# Patient Record
Sex: Male | Born: 1975 | Race: Black or African American | Hispanic: No | Marital: Married | Smoking: Former smoker
Health system: Southern US, Community
[De-identification: ages and names within clinical notes are randomized; demographics above are authoritative.]

## PROBLEM LIST (undated history)

## (undated) HISTORY — PX: APPENDECTOMY: SHX54

## (undated) HISTORY — PX: TONSILLECTOMY: SUR1361

---

## 2009-11-03 ENCOUNTER — Ambulatory Visit: Payer: Self-pay | Admitting: Radiology

## 2009-11-03 ENCOUNTER — Emergency Department (HOSPITAL_BASED_OUTPATIENT_CLINIC_OR_DEPARTMENT_OTHER): Admission: EM | Admit: 2009-11-03 | Discharge: 2009-11-03 | Payer: Self-pay | Admitting: Emergency Medicine

## 2009-11-04 ENCOUNTER — Emergency Department (HOSPITAL_BASED_OUTPATIENT_CLINIC_OR_DEPARTMENT_OTHER): Admission: EM | Admit: 2009-11-04 | Discharge: 2009-11-04 | Payer: Self-pay | Admitting: Emergency Medicine

## 2010-05-11 LAB — DIFFERENTIAL
Eosinophils Absolute: 0 10*3/uL (ref 0.0–0.7)
Lymphocytes Relative: 14 % (ref 12–46)
Lymphs Abs: 1.2 10*3/uL (ref 0.7–4.0)
Monocytes Relative: 10 % (ref 3–12)
Neutro Abs: 6.3 10*3/uL (ref 1.7–7.7)
Neutrophils Relative %: 74 % (ref 43–77)

## 2010-05-11 LAB — CBC
Hemoglobin: 16.1 g/dL (ref 13.0–17.0)
Platelets: 153 10*3/uL (ref 150–400)
RBC: 5.44 MIL/uL (ref 4.22–5.81)
WBC: 8.5 10*3/uL (ref 4.0–10.5)

## 2010-05-11 LAB — BASIC METABOLIC PANEL
Calcium: 9.6 mg/dL (ref 8.4–10.5)
Creatinine, Ser: 1.1 mg/dL (ref 0.4–1.5)
GFR calc Af Amer: >60 mL/min (ref >60)
GFR calc non Af Amer: >60 mL/min (ref >60)
Sodium: 139 mEq/L (ref 135–145)

## 2016-05-28 ENCOUNTER — Emergency Department (HOSPITAL_BASED_OUTPATIENT_CLINIC_OR_DEPARTMENT_OTHER): Payer: 59

## 2016-05-28 ENCOUNTER — Emergency Department (HOSPITAL_BASED_OUTPATIENT_CLINIC_OR_DEPARTMENT_OTHER)
Admission: EM | Admit: 2016-05-28 | Discharge: 2016-05-28 | Disposition: A | Discharge status: Home / Self Care | Payer: 59 | Attending: Emergency Medicine | Admitting: Emergency Medicine

## 2016-05-28 ENCOUNTER — Encounter (HOSPITAL_BASED_OUTPATIENT_CLINIC_OR_DEPARTMENT_OTHER): Payer: Self-pay | Admitting: Emergency Medicine

## 2016-05-28 DIAGNOSIS — Z5321 Procedure and treatment not carried out due to patient leaving prior to being seen by health care provider: Secondary | ICD-10-CM | POA: Insufficient documentation

## 2016-05-28 DIAGNOSIS — M25531 Pain in right wrist: Secondary | ICD-10-CM | POA: Diagnosis not present

## 2016-05-28 DIAGNOSIS — Z87891 Personal history of nicotine dependence: Secondary | ICD-10-CM | POA: Insufficient documentation

## 2016-05-28 NOTE — ED Notes (Signed)
Alert, NAD, calm, interactive, resps e/u, speaking in clear complete sentences, no dyspnea noted, skin W&D, VSS, c/o R wrist pain, effecting job of lifting, possible injury doing back springs/flips, rates 6/10 with some swelling, (denies: fever, numbness, tingling, weakness, sob, nausea, dizziness).

## 2016-05-28 NOTE — ED Notes (Addendum)
EDPA in to see, at [facility]. PT not in room. Unable to find. Not in xray opr w/r. LWBS/eloped.

## 2016-05-28 NOTE — ED Notes (Signed)
Pt not found in ED.  PA looked for pt in [facility] but he was not there.  Registration staff sts pt was seen leaving the ED.

## 2016-05-28 NOTE — ED Notes (Signed)
Patient transported to X-ray 

## 2016-05-28 NOTE — ED Triage Notes (Signed)
Pt c/o RT wrist pain x 3 wks; sts was doing flips several weeks ago and jammed hand/wrist when landing.

## 2016-05-29 NOTE — ED Provider Notes (Signed)
Benna Dunks Pt left without being seen.  I did not see or evaluate this patient.    [place] N Russo, PA-C 05/29/16 0140    Lyndal Pulley, MD 05/29/16 [phone]

## 2016-05-30 ENCOUNTER — Emergency Department (HOSPITAL_BASED_OUTPATIENT_CLINIC_OR_DEPARTMENT_OTHER)
Admission: EM | Admit: 2016-05-30 | Discharge: 2016-05-30 | Disposition: A | Discharge status: Home / Self Care | Payer: 59 | Attending: Emergency Medicine | Admitting: Emergency Medicine

## 2016-05-30 ENCOUNTER — Encounter (HOSPITAL_BASED_OUTPATIENT_CLINIC_OR_DEPARTMENT_OTHER): Payer: Self-pay | Admitting: *Deleted

## 2016-05-30 DIAGNOSIS — Y9389 Activity, other specified: Secondary | ICD-10-CM | POA: Diagnosis not present

## 2016-05-30 DIAGNOSIS — Z87891 Personal history of nicotine dependence: Secondary | ICD-10-CM | POA: Insufficient documentation

## 2016-05-30 DIAGNOSIS — S6991XA Unspecified injury of right wrist, hand and finger(s), initial encounter: Secondary | ICD-10-CM | POA: Diagnosis present

## 2016-05-30 DIAGNOSIS — Y929 Unspecified place or not applicable: Secondary | ICD-10-CM | POA: Insufficient documentation

## 2016-05-30 DIAGNOSIS — X500XXA Overexertion from strenuous movement or load, initial encounter: Secondary | ICD-10-CM | POA: Diagnosis not present

## 2016-05-30 DIAGNOSIS — M778 Other enthesopathies, not elsewhere classified: Secondary | ICD-10-CM | POA: Insufficient documentation

## 2016-05-30 DIAGNOSIS — Y990 Civilian activity done for income or pay: Secondary | ICD-10-CM | POA: Diagnosis not present

## 2016-05-30 MED ORDER — IBUPROFEN 600 MG PO TABS: 600.0000 mg | ORAL_TABLET | Freq: Four times a day (QID) | ORAL | 0 refills | Status: AC | PRN

## 2016-05-30 MED FILL — IBUPROFEN 600 MG TABLET: 600 | 8 days supply | Qty: 30 | Fill #0

## 2016-05-30 NOTE — ED Provider Notes (Signed)
[facility] Note   CSN: [id number] Arrival date & time: 05/30/16  1539     History   Chief Complaint Chief Complaint  Patient presents with  . Hand Pain    HPI Gary Warner is a 41 y.o. male.  HPI Patient with 3-4 weeks of right wrist pain. States pain is worse with extension of the wrist. Recently started a new job where he is doing a lot of heavy lifting. No other known injuries. Thinks had some mild swelling in the area. Denies any fever or chills. No redness or warmth. Patient is right-hand dominant. Denies any tingling or numbness sensation to the fingers. No weakness. History reviewed. No pertinent past medical history.  There are no active problems to display for this patient.   Past Surgical History:  Procedure Laterality Date  . APPENDECTOMY    . TONSILLECTOMY         Home Medications    Prior to Admission medications   Medication Sig Start Date End Date Taking? Authorizing Provider  ibuprofen (ADVIL,MOTRIN) 600 MG tablet Take 1 tablet (600 mg total) by mouth every 6 (six) hours as needed. 05/30/16   Loren Racer, MD    Family History History reviewed. No pertinent family history.  Social History Social History  Substance Use Topics  . Smoking status: Former Games developer  . Smokeless tobacco: Never Used  . Alcohol use 1.8 oz/week    3 Cans of beer per week     Comment: daily      Allergies   Patient has no known allergies.   Review of Systems Review of Systems  Constitutional: Negative for chills and fever.  Musculoskeletal: Positive for arthralgias. Negative for back pain and neck pain.  Skin: Negative for rash and wound.  Neurological: Negative for weakness and numbness.  All other systems reviewed and are negative.    Physical Exam Updated Vital Signs BP (!) 139/101 (BP Location: Left Arm)   Pulse 67   Temp 98.3 F (36.8 C) (Oral)   Resp 18   Ht  (1.727 m)   Wt 240 lb (108.9 kg)   SpO2 97%   BMI 36.49  kg/m   Physical Exam  Constitutional: He is oriented to person, place, and time. He appears well-developed and well-nourished.  HENT:  Head: Normocephalic and atraumatic.  Eyes: EOM are normal. Pupils are equal, round, and reactive to light.  Neck: Normal range of motion. Neck supple.  Cardiovascular: Normal rate.   Pulmonary/Chest: Effort normal.  Abdominal: Soft.  Musculoskeletal: Normal range of motion. He exhibits tenderness. He exhibits no edema.  Patient has tenderness to palpation along the extensor tendon of the right wrist.  mostly over the radial side. Pain is also exacerbated with extension of the wrist. No masses appreciated. No snuffbox tenderness. No erythema or warmth or swelling noted. Distal pulses are 2+. Negative Tinel and Phalen sign.  Neurological: He is alert and oriented to person, place, and time.  Sensation fully intact. Equal bilateral grip strength. Normal muscle strength of the intrinsic muscles of the right hand.  Skin: Skin is warm and dry. Capillary refill takes less than 2 seconds. No rash noted. No erythema.  Psychiatric: He has a normal mood and affect. His behavior is normal.  Nursing note and vitals reviewed.    ED Treatments / Results  Labs (all labs ordered are listed, but only abnormal results are displayed) Labs Reviewed - No data to display  EKG  EKG Interpretation None  Radiology Dg Wrist Complete Right  Result Date: 05/28/2016 CLINICAL DATA:  Pain after doing handsprings and back flips. EXAM: RIGHT WRIST - COMPLETE 3+ VIEW COMPARISON:  None. FINDINGS: There is no evidence of fracture or dislocation. Dedicated view of the scaphoid demonstrates an intact scaphoid. There is no evidence of arthropathy or other focal bone abnormality. Soft tissues are unremarkable. IMPRESSION: No acute osseous abnormality. Electronically Signed   By: Tollie Eth M.D.   On: 05/28/2016 18:35   Dg Hand Complete Right  Result Date: 05/28/2016 CLINICAL  DATA:  Radial sided hand and wrist pain after doing hand sprains of back flips. EXAM: RIGHT HAND - COMPLETE 3+ VIEW COMPARISON:  None. FINDINGS: There is no evidence of fracture or dislocation. There is no evidence of arthropathy or other focal bone abnormality. Soft tissues are unremarkable. IMPRESSION: Negative for acute fracture or malalignment. Electronically Signed   By: Tollie Eth M.D.   On: 05/28/2016 18:34    Procedures Procedures (including critical care time)  Medications Ordered in ED Medications - No data to display   Initial Impression / Assessment and Plan / ED Course  I have reviewed the triage vital signs and the nursing notes.  Pertinent labs & imaging results that were available during my care of the patient were reviewed by me and considered in my medical decision making (see chart for details).     Patient with likely overuse tendinitis of the right wrist. X-rays performed earlier demonstrate no fractures or dislocations. We'll place in a wrist splint, advised rest and start on ibuprofen. Patient is to follow-up with sports medicine should his symptoms not improve.  Final Clinical Impressions(s) / ED Diagnoses   Final diagnoses:  Right wrist tendonitis    New Prescriptions New Prescriptions   IBUPROFEN (ADVIL,MOTRIN) 600 MG TABLET    Take 1 tablet (600 mg total) by mouth every 6 (six) hours as needed.     Loren Racer, MD 05/30/16 [phone]

## 2016-05-30 NOTE — ED Triage Notes (Signed)
Pain in right wrist x several weeks.  Left AMA previously this week after having xrays done.  Denies pain unless he is moving his wrist.

## 2018-07-07 IMAGING — CR DG WRIST COMPLETE 3+V*R*
4 series · 4 of 4 positions shown · non-contrast
Comparison: None.

CLINICAL DATA: Pain after doing handsprings and back flips.

EXAM:
RIGHT WRIST - COMPLETE 3+ VIEW

[x wrist lat right]
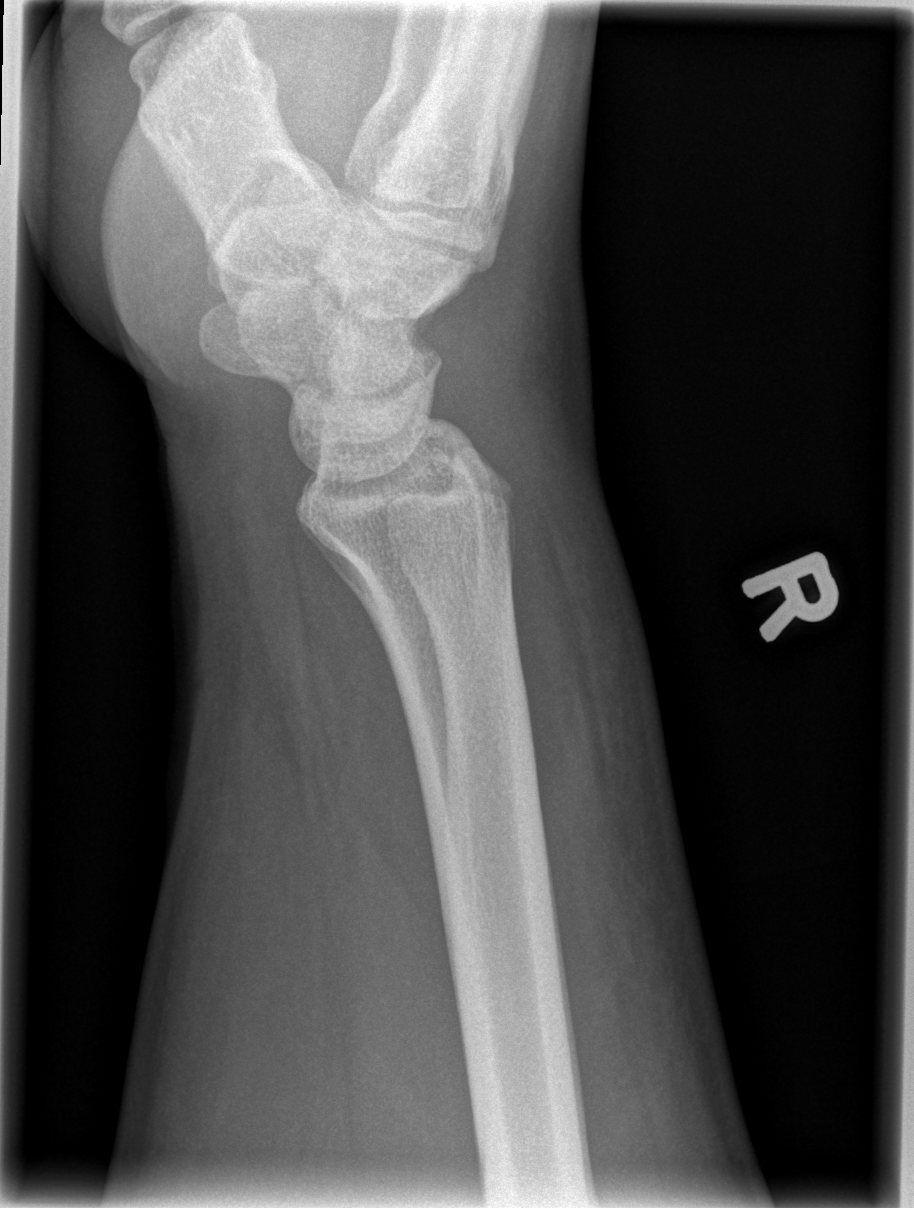

[x wrist obl right]
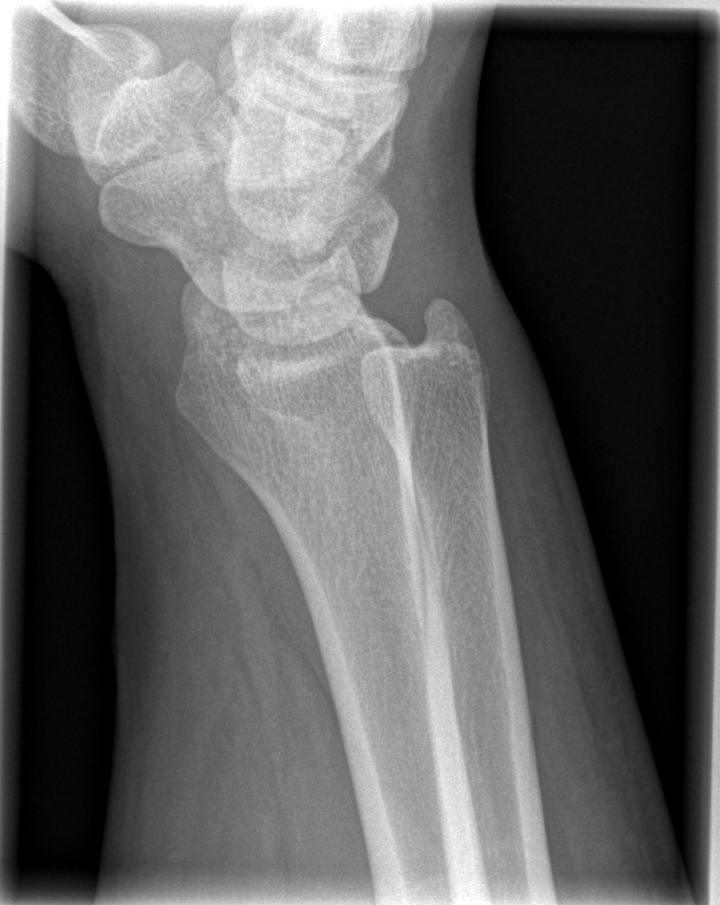

[x wrist pa right]
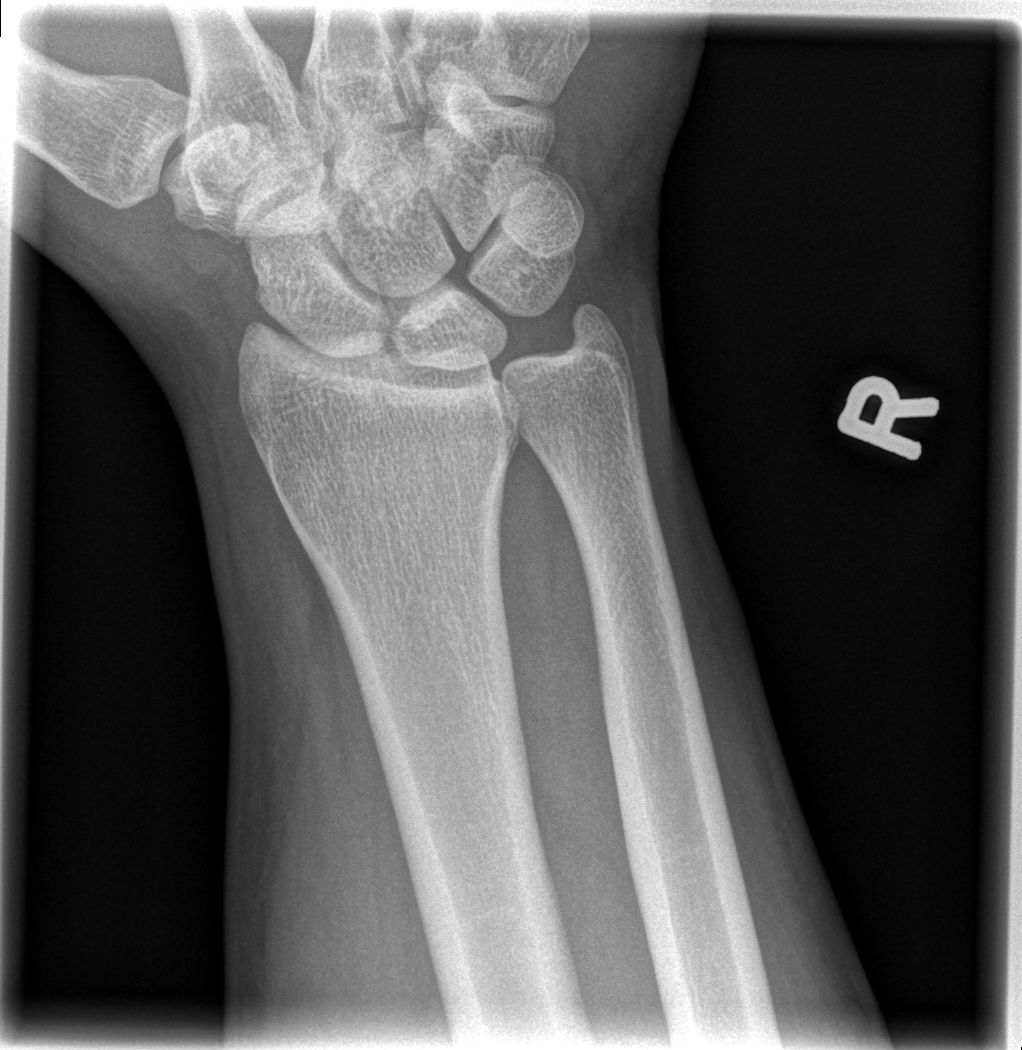

[x navicular]
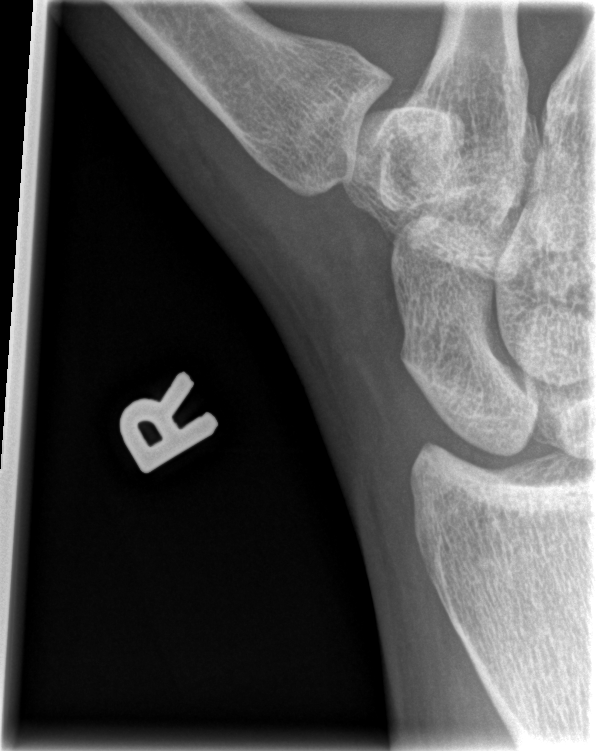

[4 of 4 positions shown; findings below may reference images not displayed]

FINDINGS: There is no evidence of fracture or dislocation. Dedicated view of
the scaphoid demonstrates an intact scaphoid. There is no evidence
of arthropathy or other focal bone abnormality. Soft tissues are
unremarkable.
IMPRESSION: No acute osseous abnormality.
